# Patient Record
Sex: Male | Born: 2000 | Race: White | Hispanic: No | Smoking: Never smoker
Health system: Southern US, Community
[De-identification: ages and names within clinical notes are randomized; demographics above are authoritative.]

## PROBLEM LIST (undated history)

## (undated) HISTORY — PX: RHINOPLASTY: SUR1284

---
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
## 2019-01-17 ENCOUNTER — Ambulatory Visit
Admission: EM | Admit: 2019-01-17 | Discharge: 2019-01-17 | Disposition: A | Discharge status: Home / Self Care | Payer: 59 | Attending: Emergency Medicine | Admitting: Emergency Medicine

## 2019-01-17 ENCOUNTER — Encounter: Payer: Self-pay | Admitting: Emergency Medicine

## 2019-01-17 ENCOUNTER — Other Ambulatory Visit: Payer: Self-pay

## 2019-01-17 DIAGNOSIS — L03032 Cellulitis of left toe: Secondary | ICD-10-CM | POA: Diagnosis not present

## 2019-01-17 MED ORDER — DOXYCYCLINE HYCLATE 100 MG PO CAPS: 100.0000 mg | ORAL_CAPSULE | Freq: Two times a day (BID) | ORAL | 0 refills | Status: AC

## 2019-01-17 MED ORDER — IBUPROFEN 600 MG PO TABS: 600.0000 mg | ORAL_TABLET | Freq: Four times a day (QID) | ORAL | 0 refills | Status: AC | PRN

## 2019-01-17 NOTE — ED Provider Notes (Signed)
HPI  SUBJECTIVE:  Eric Garcia is a 18 y.o. male who presents with intermittent stabbing left first toe pain after trimming his toenail too short 1 week ago.  He reports erythema, bloody and purulent drainage, swelling at the nail fold.  No fevers, body aches, limitation of motion of the toe, erythema streaking up his foot.  No other injury to the foot.  He has tried Epsom salt soaks with improvement in his symptoms.  Symptoms are worse with wearing shoes, walking, touching it, and accidentally hitting it against objects.  Past medical history negative for diabetes, smoking, MRSA.  PMD: Not sure.   History reviewed. No pertinent past medical history.  Past Surgical History:  Procedure Laterality Date  . RHINOPLASTY      Family History  Problem Relation Age of Onset  . Healthy Mother   . Healthy Father     Social History   Tobacco Use  . Smoking status: Never Smoker  . Smokeless tobacco: Never Used  Substance Use Topics  . Alcohol use: No    Frequency: Never  . Drug use: No    No current facility-administered medications for this encounter.   Current Outpatient Medications:  .  doxycycline (VIBRAMYCIN) 100 MG capsule, Take 1 capsule (100 mg total) by mouth 2 (two) times daily for 7 days., Disp: 14 capsule, Rfl: 0 .  ibuprofen (ADVIL) 600 MG tablet, Take 1 tablet (600 mg total) by mouth every 6 (six) hours as needed., Disp: 30 tablet, Rfl: 0  No Known Allergies   ROS  As noted in HPI.   Physical Exam  BP 134/83 (BP Location: Left Arm)   Pulse 61   Temp 97.9 F (36.6 C) (Oral)   Resp 18   Ht 5\' 10"  (1.778 m)   Wt 86.2 kg   SpO2 98%   BMI 27.26 kg/m   Constitutional: Well developed, well nourished, no acute distress Eyes:  EOMI, conjunctiva normal bilaterally HENT: Normocephalic, atraumatic,mucus membranes moist Respiratory: Normal inspiratory effort Cardiovascular: Normal rate GI: nondistended skin: No rash, skin intact Musculoskeletal: Tender erythema,  swelling at the lateral left great toe nail fold with serous drainage.  No expressible purulent drainage.  Sensation intact.  No erythema streaking up the foot.  See picture.     Neurologic: Alert & oriented x 3, no focal neuro deficits Psychiatric: Speech and behavior appropriate   ED Course   Medications - No data to display  No orders of the defined types were placed in this encounter.   No results found for this or any previous visit (from the past 24 hour(s)). No results found.  ED Clinical Impression  1. Paronychia of great toe, left      ED Assessment/Plan  No evidence of a collection of pus to I&D today.  Presentation consistent with a paronychia rather than an ingrown toenail.  Home with doxycycline for 7 days, ibuprofen 600 mg/1 g of Tylenol 3 or 4 times a day as needed for pain, continue Epson salt soaks.  Advised wearing clean sock and sandals.  Follow-up with podiatry, here, or primary care physician if he gets worse and we can reevaluate the need for an I&D or toenail removal.  Discussed  MDM, treatment plan, and plan for follow-up with patient.  patient agrees with plan.   Meds ordered this encounter  Medications  . doxycycline (VIBRAMYCIN) 100 MG capsule    Sig: Take 1 capsule (100 mg total) by mouth 2 (two) times daily for 7 days.  Dispense:  14 capsule    Refill:  0  . ibuprofen (ADVIL) 600 MG tablet    Sig: Take 1 tablet (600 mg total) by mouth every 6 (six) hours as needed.    Dispense:  30 tablet    Refill:  0    *This clinic note was created using Scientist, clinical (histocompatibility and immunogenetics)Dragon dictation software. Therefore, there may be occasional mistakes despite careful proofreading.   ?    Domenick GongMortenson, Wasyl Dornfeld, MD 01/17/19 0900

## 2019-01-17 NOTE — Discharge Instructions (Signed)
Finish the doxycycline, even if you feel better.  You may take 600 mg of ibuprofen combined with 1 g of Tylenol 3-4 times a day as needed for pain.  Continue Epsom salt soaks as often as you can.  I would wear a clean sock to keep it covered and sandals.  You Can also try putting a wedge of cotton underneath the nail when it starts to grow out to help prevent it from becoming ingrown.

## 2019-01-17 NOTE — ED Triage Notes (Signed)
Pt c/o left great toe pain on the edges of the nail. Started about a week ago.

## 2019-04-26 ENCOUNTER — Encounter: Payer: Self-pay | Admitting: Podiatry

## 2019-04-26 ENCOUNTER — Other Ambulatory Visit: Payer: Self-pay

## 2019-04-26 ENCOUNTER — Ambulatory Visit (INDEPENDENT_AMBULATORY_CARE_PROVIDER_SITE_OTHER): Payer: 59 | Admitting: Podiatry

## 2019-04-26 DIAGNOSIS — L600 Ingrowing nail: Secondary | ICD-10-CM

## 2019-04-26 MED ORDER — DOXYCYCLINE HYCLATE 100 MG PO TABS: 100.0000 mg | ORAL_TABLET | Freq: Two times a day (BID) | ORAL | 0 refills | Status: DC

## 2019-04-26 MED ORDER — GENTAMICIN SULFATE 0.1 % EX CREA: 1.0000 "application " | TOPICAL_CREAM | Freq: Two times a day (BID) | CUTANEOUS | 1 refills | Status: DC

## 2019-04-26 NOTE — Patient Instructions (Signed)

## 2019-05-01 NOTE — Progress Notes (Signed)
   Subjective: Patient presents today for evaluation of pain to the medial border of the left hallux that began about a month ago. He reports associated redness and swelling. Patient is concerned for possible ingrown nail. Touching the area increases the pain. He has been soaking the toe in Epsom salt and applying Neosporin ointment for treatment. Patient presents today for further treatment and evaluation.  No past medical history on file.  Objective:  General: Well developed, nourished, in no acute distress, alert and oriented x3   Dermatology: Skin is warm, dry and supple bilateral. Medial border of the left hallux appears to be erythematous with evidence of an ingrowing nail. Pain on palpation noted to the border of the nail fold. The remaining nails appear unremarkable at this time. There are no open sores, lesions.  Vascular: Dorsalis Pedis artery and Posterior Tibial artery pedal pulses palpable. No lower extremity edema noted.   Neruologic: Grossly intact via light touch bilateral.  Musculoskeletal: Muscular strength within normal limits in all groups bilateral. Normal range of motion noted to all pedal and ankle joints.   Assesement: #1 Paronychia with ingrowing nail medial border of the left hallux  #2 Pain in toe #3 Incurvated nail  Plan of Care:  1. Patient evaluated.  2. Discussed treatment alternatives and plan of care. Explained nail avulsion procedure and post procedure course to patient. 3. Patient opted for permanent partial nail avulsion of the medial border left hallux.  4. Prior to procedure, local anesthesia infiltration utilized using 3 ml of a 50:50 mixture of 2% plain lidocaine and 0.5% plain marcaine in a normal hallux block fashion and a betadine prep performed.  5. Partial permanent nail avulsion with chemical matrixectomy performed using [id number] applications of phenol followed by alcohol flush.  6. Light dressing applied. 7. Prescription for Gentamicin cream  provided to patient to use daily with a bandage.  8. Prescription for Doxycycline #14 provided to patient.  9. Return to clinic in 2 weeks.  Edrick Kins, DPM [facility]  Dr. Edrick Kins, [address]                                        [place], [place] [address]                Office [phone]  Fax [phone]

## 2019-05-17 ENCOUNTER — Ambulatory Visit: Payer: Self-pay | Admitting: Podiatry

## 2024-07-21 ENCOUNTER — Ambulatory Visit: Admission: EM | Admit: 2024-07-21 | Discharge: 2024-07-21 | Disposition: A | Discharge status: Home / Self Care

## 2024-07-21 ENCOUNTER — Encounter: Payer: Self-pay | Admitting: Emergency Medicine

## 2024-07-21 DIAGNOSIS — B084 Enteroviral vesicular stomatitis with exanthem: Secondary | ICD-10-CM

## 2024-07-21 NOTE — Discharge Instructions (Addendum)
 Use over-the-counter Tylenol and ibuprofen  according to the package instructions as needed for pain and fever.  Drink cool fluids as this may help you with discomfort in your mouth and also help you maintain hydration.  You may use over-the-counter colloidal oatmeal baths to help with itching.  You may also use over-the-counter antihistamine such as Claritin, Zyrtec, or Allegra as needed for itching.  You can use Sucrets or Chloraseptic lozenges but no more than 1 lozenge every 2 hours as the menthol may cause diarrhea.  Return for reevaluation, or see your pediatrician, for new or worsening symptoms.

## 2024-07-21 NOTE — ED Triage Notes (Signed)
 Patient states he had a low grade fever 3 days ago and developed red bumps on his hands and feet that started yesterday.

## 2024-07-21 NOTE — ED Provider Notes (Signed)
 " [facility]    CSN: [id number] Arrival date & time: 07/21/24  1005      History   Chief Complaint Chief Complaint  Patient presents with   Rash    Red bumps on his hands and feet    HPI Latavius Capizzi Norleen is a 23 y.o. male.   HPI  23 year old male with past medical history significant for nasal fracture and epistaxis status post rhinoplasty presents for evaluation of a rash that developed on the palms of his hands and soles of his feet 3 days ago.  He had a sore throat and runny nose nasal congestion at the outset of symptoms but those have resolved.  Also slight, nonproductive cough which is also resolved.  No fever.  History reviewed. No pertinent past medical history.  Patient Active Problem List   Diagnosis Date Noted   Deviated nasal septum 06/28/2013   Epistaxis 01/31/2013   Nasal fracture 01/31/2013    Past Surgical History:  Procedure Laterality Date   RHINOPLASTY         Home Medications    Prior to Admission medications  Medication Sig Start Date End Date Taking? Authorizing Provider  doxycycline  (VIBRA -TABS) 100 MG tablet Take 1 tablet (100 mg total) by mouth 2 (two) times daily. 04/26/19   Janit Thresa HERO, DPM  gentamicin  cream (GARAMYCIN ) 0.1 % Apply 1 application topically 2 (two) times daily. 04/26/19   Janit Thresa HERO, DPM  ibuprofen  (ADVIL ) 600 MG tablet Take 1 tablet (600 mg total) by mouth every 6 (six) hours as needed. 01/17/19   Van Knee, MD    Family History Family History  Problem Relation Age of Onset   Healthy Mother    Healthy Father     Social History Social History[1]   Allergies   Patient has no known allergies.   Review of Systems Review of Systems  Constitutional:  Negative for fever.  HENT:  Positive for congestion, rhinorrhea and sore throat. Negative for ear pain.   Respiratory:  Positive for cough.   Skin:  Positive for rash.     Physical Exam Triage Vital Signs ED Triage Vitals  Encounter  Vitals Group     BP 07/21/24 1026 118/82     Girls Systolic BP Percentile --      Girls Diastolic BP Percentile --      Boys Systolic BP Percentile --      Boys Diastolic BP Percentile --      Pulse Rate 07/21/24 1026 67     Resp 07/21/24 1026 15     Temp 07/21/24 1026 98.1 F (36.7 C)     Temp Source 07/21/24 1026 Oral     SpO2 07/21/24 1026 97 %     Weight 07/21/24 1023 193 lb 12.8 oz (87.9 kg)     Height 07/21/24 1023 5' 10 (1.778 m)     Head Circumference --      Peak Flow --      Pain Score 07/21/24 1023 0     Pain Loc --      Pain Education --      Exclude from Growth Chart --    No data found.  Updated Vital Signs BP 118/82 (BP Location: Right Arm)   Pulse 67   Temp 98.1 F (36.7 C) (Oral)   Resp 15   Ht 5' 10 (1.778 m)   Wt 193 lb 12.8 oz (87.9 kg)   SpO2 97%   BMI 27.81 kg/m  Visual Acuity Right Eye Distance:   Left Eye Distance:   Bilateral Distance:    Right Eye Near:   Left Eye Near:    Bilateral Near:     Physical Exam Vitals and nursing note reviewed.  Constitutional:      Appearance: Normal appearance. He is not ill-appearing.  HENT:     Head: Normocephalic and atraumatic.     Mouth/Throat:     Mouth: Mucous membranes are moist.     Pharynx: Oropharynx is clear. Posterior oropharyngeal erythema present. No oropharyngeal exudate.     Comments: Erythema to the soft palate and uvula as well as red spots on the tongue. Cardiovascular:     Rate and Rhythm: Normal rate and regular rhythm.     Pulses: Normal pulses.     Heart sounds: Normal heart sounds. No murmur heard.    No friction rub. No gallop.  Pulmonary:     Effort: Pulmonary effort is normal.     Breath sounds: Normal breath sounds. No wheezing, rhonchi or rales.  Musculoskeletal:     Cervical back: Normal range of motion and neck supple. No tenderness.  Lymphadenopathy:     Cervical: No cervical adenopathy.  Skin:    General: Skin is warm and dry.     Capillary Refill:  Capillary refill takes less than 2 seconds.     Findings: Rash present.  Neurological:     General: No focal deficit present.     Mental Status: He is alert and oriented to person, place, and time.      UC Treatments / Results  Labs (all labs ordered are listed, but only abnormal results are displayed) Labs Reviewed - No data to display  EKG   Radiology No results found.  Procedures Procedures (including critical care time)  Medications Ordered in UC Medications - No data to display  Initial Impression / Assessment and Plan / UC Course  I have reviewed the triage vital signs and the nursing notes.  Pertinent labs & imaging results that were available during my care of the patient were reviewed by me and considered in my medical decision making (see chart for details).   Patient is a pleasant 23 year old male presenting with request for evaluation due to developing an itchy rash on both palms and the soles of his feet 3 days ago.  He did have some associated runny nose, nasal congestion, sore throat, and cough but those have resolved.    As you see in the images above, the patient has erythematous maculopapular lesions on the palmar surfaces of both hands.  None on the dorsal surfaces.  He reports similar rash on the soles of his feet.  He also has some mild erythema to his uvula as well as red spots on the tongue but no tonsillar pillar edema or exudate noted.  He reports that he has to young child at home who is currently experiencing respiratory symptoms that attends daycare.  He came in because he is concerned and does not want to give him hand-foot-and-mouth.  I advised the patient that he may very well of contracted it from his son given that he is experiencing respiratory symptoms.  His son's rash may yet develop.  I advised him to keep his distance is much as possible from his sun and to mask whenever possible.  He may use Tylenol and or ibuprofen  as needed for pain.  He may  also use over-the-counter antihistamine such as Claritin, Zyrtec, Allegra, and Benadryl  as needed for itching along with colloidal oatmeal baths.  Return precautions reviewed.  He denies need for work note.   Final Clinical Impressions(s) / UC Diagnoses   Final diagnoses:  Hand, foot and mouth disease (HFMD)     Discharge Instructions      Use over-the-counter Tylenol and ibuprofen  according to the package instructions as needed for pain and fever.  Drink cool fluids as this may help you with discomfort in your mouth and also help you maintain hydration.  You may use over-the-counter colloidal oatmeal baths to help with itching.  You may also use over-the-counter antihistamine such as Claritin, Zyrtec, or Allegra as needed for itching.  You can use Sucrets or Chloraseptic lozenges but no more than 1 lozenge every 2 hours as the menthol may cause diarrhea.  Return for reevaluation, or see your pediatrician, for new or worsening symptoms.      ED Prescriptions   None    PDMP not reviewed this encounter.    [1]  Social History Tobacco Use   Smoking status: Never   Smokeless tobacco: Never  Vaping Use   Vaping status: Never Used  Substance Use Topics   Alcohol use: No   Drug use: No     Bernardino Ditch, NP 07/21/24 1044  "
